# Patient Record
Sex: Female | Born: 1955 | Race: White | Hispanic: No | Smoking: Current every day smoker
Health system: Southern US, Community
[De-identification: ages and names within clinical notes are randomized; demographics above are authoritative.]

## PROBLEM LIST (undated history)

## (undated) DIAGNOSIS — J449 Chronic obstructive pulmonary disease, unspecified: Secondary | ICD-10-CM

## (undated) HISTORY — PX: BACK SURGERY: SHX140

## (undated) HISTORY — PX: APPENDECTOMY: SHX54

## (undated) HISTORY — PX: LEG SURGERY: SHX1003

## (undated) HISTORY — PX: ABDOMINAL HYSTERECTOMY: SHX81

## (undated) HISTORY — PX: HERNIA REPAIR: SHX51

## (undated) HISTORY — PX: PERIPHERAL VASCULAR THROMBECTOMY: CATH118306

---

## 2018-06-05 ENCOUNTER — Emergency Department: Payer: 59

## 2018-06-05 ENCOUNTER — Encounter: Payer: Self-pay | Admitting: *Deleted

## 2018-06-05 ENCOUNTER — Emergency Department
Admission: EM | Admit: 2018-06-05 | Discharge: 2018-06-05 | Disposition: A | Discharge status: Home / Self Care | Payer: 59 | Attending: Student in an Organized Health Care Education/Training Program | Admitting: Student in an Organized Health Care Education/Training Program

## 2018-06-05 ENCOUNTER — Other Ambulatory Visit: Payer: Self-pay

## 2018-06-05 DIAGNOSIS — R2241 Localized swelling, mass and lump, right lower limb: Secondary | ICD-10-CM | POA: Insufficient documentation

## 2018-06-05 DIAGNOSIS — Y999 Unspecified external cause status: Secondary | ICD-10-CM | POA: Diagnosis not present

## 2018-06-05 DIAGNOSIS — L03115 Cellulitis of right lower limb: Secondary | ICD-10-CM

## 2018-06-05 DIAGNOSIS — Y929 Unspecified place or not applicable: Secondary | ICD-10-CM | POA: Diagnosis not present

## 2018-06-05 DIAGNOSIS — X58XXXA Exposure to other specified factors, initial encounter: Secondary | ICD-10-CM | POA: Diagnosis not present

## 2018-06-05 DIAGNOSIS — Y939 Activity, unspecified: Secondary | ICD-10-CM | POA: Insufficient documentation

## 2018-06-05 DIAGNOSIS — F17200 Nicotine dependence, unspecified, uncomplicated: Secondary | ICD-10-CM | POA: Diagnosis not present

## 2018-06-05 DIAGNOSIS — L03113 Cellulitis of right upper limb: Secondary | ICD-10-CM | POA: Insufficient documentation

## 2018-06-05 DIAGNOSIS — S90821A Blister (nonthermal), right foot, initial encounter: Secondary | ICD-10-CM | POA: Diagnosis present

## 2018-06-05 LAB — BASIC METABOLIC PANEL
ANION GAP: 8 (ref 5–15)
BUN: 6 mg/dL — ABNORMAL LOW (ref 8–23)
CHLORIDE: 105 mmol/L (ref 98–111)
CO2: 31 mmol/L (ref 22–32)
Calcium: 8.7 mg/dL — ABNORMAL LOW (ref 8.9–10.3)
Creatinine, Ser: 0.5 mg/dL (ref 0.44–1.00)
GFR calc non Af Amer: >60 mL/min (ref >60)
Glucose, Bld: 94 mg/dL (ref 70–99)
Potassium: 3.4 mmol/L — ABNORMAL LOW (ref 3.5–5.1)
SODIUM: 144 mmol/L (ref 135–145)

## 2018-06-05 LAB — CBC
HEMATOCRIT: 39.2 % (ref 36.0–46.0)
Hemoglobin: 12.4 g/dL (ref 12.0–15.0)
MCH: 30.3 pg (ref 26.0–34.0)
MCHC: 31.6 g/dL (ref 30.0–36.0)
MCV: 95.8 fL (ref 80.0–100.0)
NRBC: 0 % (ref 0.0–0.2)
PLATELETS: 306 10*3/uL (ref 150–400)
RBC: 4.09 MIL/uL (ref 3.87–5.11)
RDW: 16 % — ABNORMAL HIGH (ref 11.5–15.5)
WBC: 6.9 10*3/uL (ref 4.0–10.5)

## 2018-06-05 MED ORDER — HYDROCODONE-ACETAMINOPHEN 5-325 MG PO TABS: 1.0000 | ORAL_TABLET | ORAL | 0 refills | Status: AC | PRN

## 2018-06-05 MED ORDER — BACITRACIN ZINC 500 UNIT/GM EX OINT
TOPICAL_OINTMENT | CUTANEOUS | Status: AC
  Filled 2018-06-05: qty 0.9

## 2018-06-05 MED ORDER — DOXYCYCLINE HYCLATE 100 MG PO TABS
100.0000 mg | ORAL_TABLET | Freq: Once | ORAL | Status: AC
  Administered 2018-06-05: 100 mg via ORAL
  Filled 2018-06-05: qty 1

## 2018-06-05 MED ORDER — APIXABAN 5 MG PO TABS: 5.0000 mg | ORAL_TABLET | Freq: Two times a day (BID) | ORAL | 1 refills | Status: AC

## 2018-06-05 MED ORDER — DOXYCYCLINE HYCLATE 100 MG PO TABS: 100.0000 mg | ORAL_TABLET | Freq: Two times a day (BID) | ORAL | 0 refills | Status: AC

## 2018-06-05 MED ORDER — HYDROCODONE-ACETAMINOPHEN 5-325 MG PO TABS
1.0000 | ORAL_TABLET | Freq: Once | ORAL | Status: AC
  Administered 2018-06-05: 1 via ORAL
  Filled 2018-06-05: qty 1

## 2018-06-05 MED ORDER — BACITRACIN ZINC 500 UNIT/GM EX OINT
TOPICAL_OINTMENT | Freq: Once | CUTANEOUS | Status: AC
  Administered 2018-06-05: 1 via TOPICAL

## 2018-06-05 NOTE — ED Notes (Addendum)
Pt returned from ultrasound

## 2018-06-05 NOTE — ED Notes (Signed)
Xray at the bedside.

## 2018-06-05 NOTE — ED Provider Notes (Signed)
[facility] Emergency Department Provider Note    First MD Initiated Contact with Patient 06/05/18 1841     (approximate)  I have reviewed the triage vital signs and the nursing notes.   HISTORY  Chief Complaint Foot Pain    HPI Katria Botts is a 62 y.o. female with a history of peripheral arterial disease who does smoke daily presents the ER with ulcer to the base of the heel for the past week.  No history of diabetes.  States that she noticed a foul odor today.  States that she thought that the swelling redness is related to blister from wearing her boots after she recently moved.  Denies any other trauma.  States the pain is mild to moderate.  Has not been on any antibiotics recently.    No past medical history on file. No family history on file.  There are no active problems to display for this patient.     Prior to Admission medications   Medication Sig Start Date End Date Taking? Authorizing Provider  apixaban (ELIQUIS) 5 MG TABS tablet Take 1 tablet (5 mg total) by mouth 2 (two) times daily. For days 1-7 take 10mg  (two pills) twice daily 06/05/18   Willy Eddy, MD  doxycycline (VIBRA-TABS) 100 MG tablet Take 1 tablet (100 mg total) by mouth 2 (two) times daily for 7 days. 06/05/18 06/12/18  Willy Eddy, MD  HYDROcodone-acetaminophen (NORCO) 5-325 MG tablet Take 1 tablet by mouth every 4 (four) hours as needed for moderate pain. 06/05/18   Willy Eddy, MD    Allergies Penciclovir and Penicillins    Social History Social History   Tobacco Use  . Smoking status: Current Every Day Smoker  . Smokeless tobacco: Never Used  Substance Use Topics  . Alcohol use: Yes  . Drug use: Never    Review of Systems Patient denies headaches, rhinorrhea, blurry vision, numbness, shortness of breath, chest pain, edema, cough, abdominal pain, nausea, vomiting, diarrhea, dysuria, fevers, rashes or hallucinations unless otherwise stated above  in HPI. ____________________________________________   PHYSICAL EXAM:  VITAL SIGNS: Vitals:   06/05/18 2036 06/05/18 2111  BP: 110/69 104/69  Pulse: 62 65  Resp: 16 16  Temp:    SpO2: 96% 97%    Constitutional: Alert and oriented.  Eyes: Conjunctivae are normal.  Head: Atraumatic. Nose: No congestion/rhinnorhea. Mouth/Throat: Mucous membranes are moist.   Neck: No stridor. Painless ROM.  Cardiovascular: Normal rate, regular rhythm. Grossly normal heart sounds.  Good peripheral circulation. Respiratory: Normal respiratory effort.  No retractions. Lungs CTAB. Gastrointestinal: Soft and nontender. No distention. No abdominal bruits. No CVA tenderness. Genitourinary:  Musculoskeletal: Right heel with 3 cm avulsed blister on the medial aspect no purulent drainage at this point.  There is surrounding cellulitis.  No crepitus.  Able to palpate DP or PT pulses but strong DP and PT Doppler signals present bilaterally.  Brisk cap refill present.  No joint effusions. Neurologic:  Normal speech and language. No gross focal neurologic deficits are appreciated. No facial droop Skin:  Skin is warm, dry and intact. No rash noted. Psychiatric: Mood and affect are normal. Speech and behavior are normal.  ____________________________________________   LABS (all labs ordered are listed, but only abnormal results are displayed)  Results for orders placed or performed during the hospital encounter of 06/05/18 (from the past 24 hour(s))  Basic metabolic panel     Status: Abnormal   Collection Time: 06/05/18  3:32 PM  Result Value Ref  Range   Sodium 144 135 - 145 mmol/L   Potassium 3.4 (L) 3.5 - 5.1 mmol/L   Chloride 105 98 - 111 mmol/L   CO2 31 22 - 32 mmol/L   Glucose, Bld 94 70 - 99 mg/dL   BUN 6 (L) 8 - 23 mg/dL   Creatinine, Ser 1.61 0.44 - 1.00 mg/dL   Calcium 8.7 (L) 8.9 - 10.3 mg/dL   GFR calc non Af Amer >60 >60 mL/min   GFR calc Af Amer >60 >60 mL/min   Anion gap 8 5 - 15  CBC      Status: Abnormal   Collection Time: 06/05/18  3:32 PM  Result Value Ref Range   WBC 6.9 4.0 - 10.5 K/uL   RBC 4.09 3.87 - 5.11 MIL/uL   Hemoglobin 12.4 12.0 - 15.0 g/dL   HCT 09.6 04.5 - 40.9 %   MCV 95.8 80.0 - 100.0 fL   MCH 30.3 26.0 - 34.0 pg   MCHC 31.6 30.0 - 36.0 g/dL   RDW 81.1 (H) 91.4 - 78.2 %   Platelets 306 150 - 400 K/uL   nRBC 0.0 0.0 - 0.2 %   ____________________________________________   ____________________________________________  RADIOLOGY  I personally reviewed all radiographic images ordered to evaluate for the above acute complaints and reviewed radiology reports and findings.  These findings were personally discussed with the patient.  Please see medical record for radiology report.  ____________________________________________   PROCEDURES  Procedure(s) performed:  Procedures    Critical Care performed: no ____________________________________________   INITIAL IMPRESSION / ASSESSMENT AND PLAN / ED COURSE  Pertinent labs & imaging results that were available during my care of the patient were reviewed by me and considered in my medical decision making (see chart for details).   DDX: Cellulitis, diabetic ulcer, limb ischemia, osteo-, edema, DVT, fracture  Orlanda Frankum is a 62 y.o. who presents to the ED with his as described above.  Patient nontoxic-appearing.  Does have palpable pulses and strong Doppler signal to the PT and DP.  Not consistent with necrotizing fasciitis.  Likely some soft tissue breakdown causing surrounding cellulitis however given swelling and edema will order ultrasound as well as x-ray.  Clinical Course as of Jun 05 2350  Mon Jun 05, 2018  2046 X-rays show some edema and given the evidence of nonocclusive thrombus in lower extremity with her smoking history will treat with oral antibiotics.  No evidence of acute limb ischemia.  Discussed risks and benefits of anticoagulation versus conservative management regarding thrombus  found.  Given her risk factors will treat with Eliquis.  Discussed signs symptoms and risks of bleeding patient agrees to trial of Eliquis therapy.   [PR]    Clinical Course User Index [PR] Willy Eddy, MD     As part of my medical decision making, I reviewed the following data within the electronic MEDICAL RECORD NUMBER Nursing notes reviewed and incorporated, Labs reviewed, notes from prior ED visits.   ____________________________________________   FINAL CLINICAL IMPRESSION(S) / ED DIAGNOSES  Final diagnoses:  Cellulitis of right lower extremity      NEW MEDICATIONS STARTED DURING THIS VISIT:  Discharge Medication List as of 06/05/2018  8:37 PM    START taking these medications   Details  apixaban (ELIQUIS) 5 MG TABS tablet Take 1 tablet (5 mg total) by mouth 2 (two) times daily. For days 1-7 take 10mg  (two pills) twice daily, Starting Mon 06/05/2018, Print    doxycycline (VIBRA-TABS) 100 MG tablet  Take 1 tablet (100 mg total) by mouth 2 (two) times daily for 7 days., Starting Mon 06/05/2018, Until Mon 06/12/2018, Print    HYDROcodone-acetaminophen (NORCO) 5-325 MG tablet Take 1 tablet by mouth every 4 (four) hours as needed for moderate pain., Starting Mon 06/05/2018, Print         Note:  This document was prepared using Dragon voice recognition software and may include unintentional dictation errors.    Willy Eddyobinson, Renesmay Nesbitt, MD 06/05/18 2351

## 2018-06-05 NOTE — ED Triage Notes (Signed)
Pt has swelling to right foot with ulcer to the heel for 1 week. Foul odor noted.  No hx diabetes.  Pt alert.

## 2018-06-05 NOTE — ED Notes (Addendum)
Pt to ultrasound

## 2018-06-05 NOTE — Discharge Instructions (Addendum)
Return for worsening pain, fevers, worsening redness or for any additional questions or concerns.

## 2018-07-06 ENCOUNTER — Emergency Department: Payer: 59

## 2018-07-06 ENCOUNTER — Encounter: Payer: Self-pay | Admitting: Emergency Medicine

## 2018-07-06 ENCOUNTER — Emergency Department
Admission: EM | Admit: 2018-07-06 | Discharge: 2018-07-06 | Disposition: A | Discharge status: Home / Self Care | Payer: 59 | Attending: Emergency Medicine | Admitting: Emergency Medicine

## 2018-07-06 DIAGNOSIS — Z7901 Long term (current) use of anticoagulants: Secondary | ICD-10-CM | POA: Insufficient documentation

## 2018-07-06 DIAGNOSIS — Z79899 Other long term (current) drug therapy: Secondary | ICD-10-CM | POA: Insufficient documentation

## 2018-07-06 DIAGNOSIS — M542 Cervicalgia: Secondary | ICD-10-CM | POA: Diagnosis present

## 2018-07-06 DIAGNOSIS — W19XXXA Unspecified fall, initial encounter: Secondary | ICD-10-CM

## 2018-07-06 DIAGNOSIS — Z7984 Long term (current) use of oral hypoglycemic drugs: Secondary | ICD-10-CM | POA: Diagnosis not present

## 2018-07-06 DIAGNOSIS — R51 Headache: Secondary | ICD-10-CM | POA: Diagnosis not present

## 2018-07-06 DIAGNOSIS — W108XXA Fall (on) (from) other stairs and steps, initial encounter: Secondary | ICD-10-CM | POA: Insufficient documentation

## 2018-07-06 DIAGNOSIS — Y929 Unspecified place or not applicable: Secondary | ICD-10-CM | POA: Diagnosis not present

## 2018-07-06 DIAGNOSIS — Y999 Unspecified external cause status: Secondary | ICD-10-CM | POA: Insufficient documentation

## 2018-07-06 DIAGNOSIS — Y939 Activity, unspecified: Secondary | ICD-10-CM | POA: Diagnosis not present

## 2018-07-06 DIAGNOSIS — J449 Chronic obstructive pulmonary disease, unspecified: Secondary | ICD-10-CM | POA: Insufficient documentation

## 2018-07-06 DIAGNOSIS — F17210 Nicotine dependence, cigarettes, uncomplicated: Secondary | ICD-10-CM | POA: Insufficient documentation

## 2018-07-06 HISTORY — DX: Chronic obstructive pulmonary disease, unspecified: J44.9

## 2018-07-06 MED ORDER — METHOCARBAMOL 500 MG PO TABS: 500.0000 mg | ORAL_TABLET | Freq: Three times a day (TID) | ORAL | 0 refills | Status: AC | PRN

## 2018-07-06 NOTE — ED Triage Notes (Signed)
Patient presents to the ED post fall.  Patient states she takes eliquis and she was told to come to the ED if she hit her head and she did.  Patient states she fell approx. 2 hours ago.  Patient states she missed the last two steps of her stairs and fell on her back and hit her head.  Patient denies loss of consciousness.  Patient denies nausea and vomiting.

## 2018-07-06 NOTE — ED Provider Notes (Signed)
[facility] Emergency Department Provider Note  ____________________________________________  Time seen: Approximately 8:52 PM  I have reviewed the triage vital signs and the nursing notes.   HISTORY  Chief Complaint Fall    HPI Lisa Stark is a 62 y.o. female presents to the emergency department after a fall that occurred tonight.  Patient reports that she missed the last 2 steps and fell backwards hitting her head.  Patient is currently taking Eliquis and became concerned.  She experienced no loss of consciousness but did have some neck discomfort.  No numbness or tingling in the upper or lower extremities.  She denies chest pain, chest tightness, shortness of breath, nausea, vomiting abdominal pain.  She has been able to ambulate without difficulty.  Patient is requesting oxycodone 10 mg for pain.   Past Medical History:  Diagnosis Date  . COPD (chronic obstructive pulmonary disease) (HCC)     There are no active problems to display for this patient.    Prior to Admission medications   Medication Sig Start Date End Date Taking? Authorizing Provider  apixaban (ELIQUIS) 5 MG TABS tablet Take 1 tablet (5 mg total) by mouth 2 (two) times daily. For days 1-7 take 10mg  (two pills) twice daily 06/05/18   Willy Eddyobinson, Patrick, MD  HYDROcodone-acetaminophen [facility](NORCO) 5-325 MG tablet Take 1 tablet by mouth every 4 (four) hours as needed for moderate pain. 06/05/18   Willy Eddyobinson, Patrick, MD  methocarbamol (ROBAXIN) 500 MG tablet Take 1 tablet (500 mg total) by mouth every 8 (eight) hours as needed for up to 5 days. 07/06/18 07/11/18  Orvil FeilWoods, Karol Liendo M, PA-C    Allergies Penciclovir and Penicillins  No family history on file.  Social History Social History   Tobacco Use  . Smoking status: Current Every Day Smoker    Packs/day: 0.50    Types: Cigarettes  . Smokeless tobacco: Never Used  Substance Use Topics  . Alcohol use: Yes    Comment: occasionally  . Drug use:  Never     Review of Systems  Constitutional: No fever/chills Eyes: No visual changes. No discharge ENT: No upper respiratory complaints. Cardiovascular: no chest pain. Respiratory: no cough. No SOB. Gastrointestinal: No abdominal pain.  No nausea, no vomiting.  No diarrhea.  No constipation. Musculoskeletal: Patient has neck pain. Skin: Negative for rash, abrasions, lacerations, ecchymosis. Neurological: Patient has mild headache, no focal weakness or numbness.   ____________________________________________   PHYSICAL EXAM:  VITAL SIGNS: ED Triage Vitals  Enc Vitals Group     BP 07/06/18 1845 125/88     Pulse Rate 07/06/18 1845 76     Resp 07/06/18 1845 18     Temp 07/06/18 1845 98.3 F (36.8 C)     Temp Source 07/06/18 1845 Oral     SpO2 07/06/18 1845 97 %     Weight 07/06/18 1848 170 lb (77.1 kg)     Height 07/06/18 1848 5\' 9"  (1.753 m)     Head Circumference --      Peak Flow --      Pain Score 07/06/18 1846 8     Pain Loc --      Pain Edu? --      Excl. in GC? --      Constitutional: Alert and oriented. Well appearing and in no acute distress. Eyes: Conjunctivae are normal. PERRL. EOMI. Head: Atraumatic. ENT:      Ears: TMs are pearly.      Nose: No congestion/rhinnorhea.  Mouth/Throat: Mucous membranes are moist.  Neck: No stridor.  No cervical spine tenderness to palpation.  Full range of motion.  No midline C-spine tenderness. Cardiovascular: Normal rate, regular rhythm. Normal S1 and S2.  Good peripheral circulation. Respiratory: Normal respiratory effort without tachypnea or retractions. Lungs CTAB. Good air entry to the bases with no decreased or absent breath sounds. Gastrointestinal: Bowel sounds 4 quadrants. Soft and nontender to palpation. No guarding or rigidity. No palpable masses. No distention. No CVA tenderness. Musculoskeletal: Full range of motion to all extremities. No gross deformities appreciated. Neurologic:  Normal speech and  language. No gross focal neurologic deficits are appreciated.  Skin:  Skin is warm, dry and intact. No rash noted. Psychiatric: Mood and affect are normal. Speech and behavior are normal. Patient exhibits appropriate insight and judgement.   ____________________________________________   LABS (all labs ordered are listed, but only abnormal results are displayed)  Labs Reviewed - No data to display ____________________________________________  EKG   ____________________________________________  RADIOLOGY I personally viewed and evaluated these images as part of my medical decision making, as well as reviewing the written report by the radiologist.  Ct Head Wo Contrast  Result Date: 07/06/2018 CLINICAL DATA:  Patient status post fall hitting the back of her head. EXAM: CT HEAD WITHOUT CONTRAST CT CERVICAL SPINE WITHOUT CONTRAST TECHNIQUE: Multidetector CT imaging of the head and cervical spine was performed following the standard protocol without intravenous contrast. Multiplanar CT image reconstructions of the cervical spine were also generated. COMPARISON:  None. FINDINGS: CT HEAD FINDINGS Brain: Ventricles and sulci are appropriate for patient's age. No evidence for acute cortically based infarct, intracranial hemorrhage, mass lesion or mass-effect. 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 Vascular: Unremarkable Skull: Intact. Findings suggestive of remote right zygomatic arch fracture. Sinuses/Orbits: Paranasal sinuses well aerated. Mild mucosal thickening right frontal sinus. Mastoid air cells unremarkable. Other: None. CT CERVICAL SPINE FINDINGS Alignment: Grade 1 anterolisthesis of C4 on C5 likely secondary to facet degenerative changes. Degenerative disc disease most pronounced C5-6, C6-7 C7-T1. Skull base and vertebrae: No acute fracture. No primary bone lesion or focal pathologic process. Soft tissues and spinal canal: No  prevertebral fluid or swelling. No visible canal hematoma. Disc levels: C5-6, C6-7 and C7-T1 degenerative changes. No acute fracture. Upper chest: Emphysematous change. Other: None. IMPRESSION: 1. No acute intracranial process. 2. No acute cervical spine fracture. 3. Multilevel degenerative disc and facet disease. Electronically Signed   By: Annia Beltrew  Davis M.D.   On: 07/06/2018 19:43   Ct Cervical Spine Wo Contrast  Result Date: 07/06/2018 CLINICAL DATA:  Patient status post fall hitting the back of her head. EXAM: CT HEAD WITHOUT CONTRAST CT CERVICAL SPINE WITHOUT CONTRAST TECHNIQUE: Multidetector CT imaging of the head and cervical spine was performed following the standard protocol without intravenous contrast. Multiplanar CT image reconstructions of the cervical spine were also generated. COMPARISON:  None. FINDINGS: CT HEAD FINDINGS Brain: Ventricles and sulci are appropriate for patient's age. No evidence for acute cortically based infarct, intracranial hemorrhage, mass lesion or mass-effect. 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 Vascular: Unremarkable Skull: Intact. Findings suggestive of remote right zygomatic arch fracture. Sinuses/Orbits: Paranasal sinuses well aerated. Mild mucosal thickening right frontal sinus. Mastoid air cells unremarkable. Other: None. CT CERVICAL SPINE FINDINGS Alignment: Grade 1 anterolisthesis of C4 on C5 likely secondary to facet degenerative changes. Degenerative disc disease most pronounced C5-6, C6-7 C7-T1.  Skull base and vertebrae: No acute fracture. No primary bone lesion or focal pathologic process. Soft tissues and spinal canal: No prevertebral fluid or swelling. No visible canal hematoma. Disc levels: C5-6, C6-7 and C7-T1 degenerative changes. No acute fracture. Upper chest: Emphysematous change. Other: None. IMPRESSION: 1. No acute intracranial process. 2. No acute cervical spine fracture. 3. Multilevel  degenerative disc and facet disease. Electronically Signed   By: Annia Belt M.D.   On: 07/06/2018 19:43    ____________________________________________    PROCEDURES  Procedure(s) performed:    Procedures    Medications - No data to display   ____________________________________________   INITIAL IMPRESSION / ASSESSMENT AND PLAN / ED COURSE  Pertinent labs & imaging results that were available during my care of the patient were reviewed by me and considered in my medical decision making (see chart for details).  Review of the [place] CSRS was performed in accordance of the [facility] prior to dispensing any controlled drugs.      Assessment and plan Fall Patient presents to the emergency department after a fall that occurred earlier this evening.  Patient missed the last 2 steps as she was walking from her house.  CT head and neck revealed no acute abnormality.  Patient requested oxycodone tens as she is currently in the process of establishing care with pain management.  Patient's request was denied as patient did not sustain a fracture and has a reassuring physical exam. Patient did request a muscle relaxer.  Patient was discharged with Robaxin.  Vital signs were reassuring prior to discharge.  All patient questions were answered.   ____________________________________________  FINAL CLINICAL IMPRESSION(S) / ED DIAGNOSES  Final diagnoses:  Fall, initial encounter      NEW MEDICATIONS STARTED DURING THIS VISIT:  ED Discharge Orders         Ordered    methocarbamol (ROBAXIN) 500 MG tablet  Every 8 hours PRN     07/06/18 2031              This chart was dictated using voice recognition software/Dragon. Despite best efforts to proofread, errors can occur which can change the meaning. Any change was purely unintentional.    Orvil Feil, PA-C 07/06/18 2103    Arnaldo Natal, MD 07/07/18 [phone]

## 2019-10-04 IMAGING — CT CT HEAD W/O CM
4 of 7 series · 14 of 47 positions shown, 15 images · non-contrast
Comparison: None.

CLINICAL DATA: Patient status post fall hitting the back of her
head.

EXAM:
CT HEAD WITHOUT CONTRAST
CT CERVICAL SPINE WITHOUT CONTRAST
TECHNIQUE: Multidetector CT imaging of the head and cervical spine was
performed following the standard protocol without intravenous
contrast. Multiplanar CT image reconstructions of the cervical spine
were also generated.

[Series 2: head wo · axial · 0.47mm/px · z∈[-111,-56]mm · 2 of 33 slices shown, 3 images]
[im 11/33  brain]
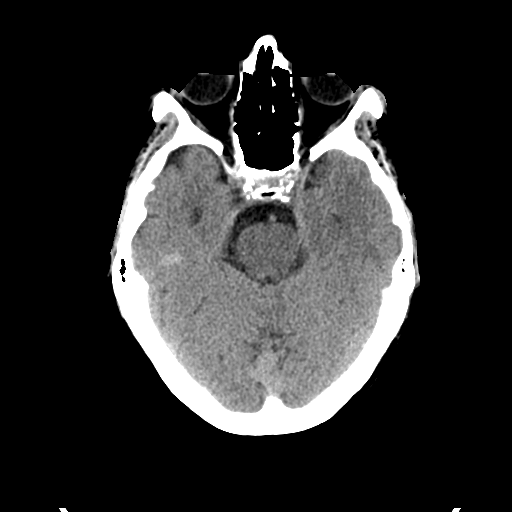
[im 11/33  bone]
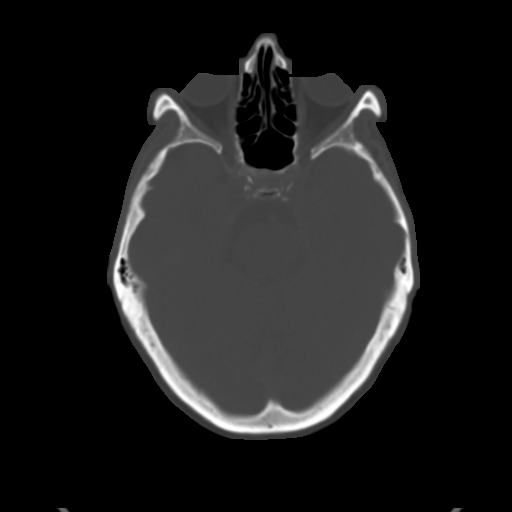
[im 22/33  brain]
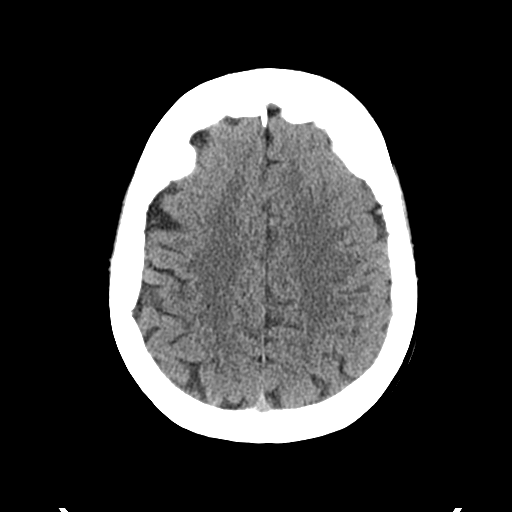

[Series 4: coronal soft tissue · coronal · 0.32mm/px · 2 of 67 slices shown]
[im 13/67  brain]
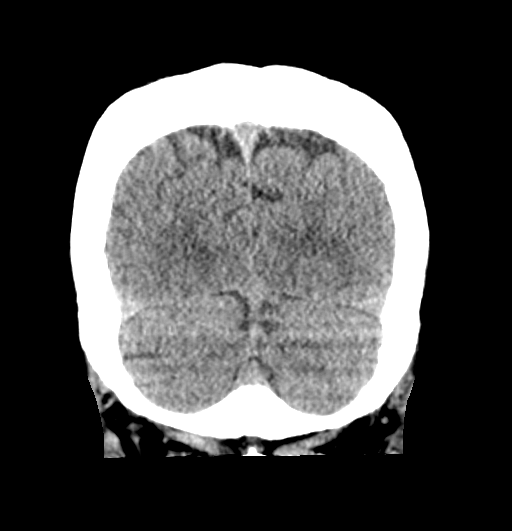
[im 26/67  brain]
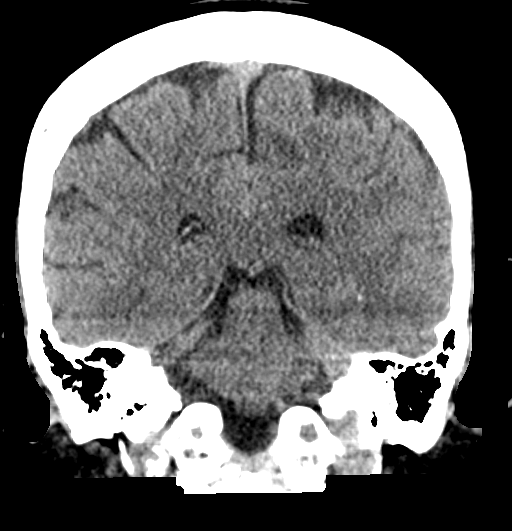

[Series 5: sagittal soft tissue · sagittal · 0.33mm/px · 2 of 54 slices shown]
[im 18/54  brain]
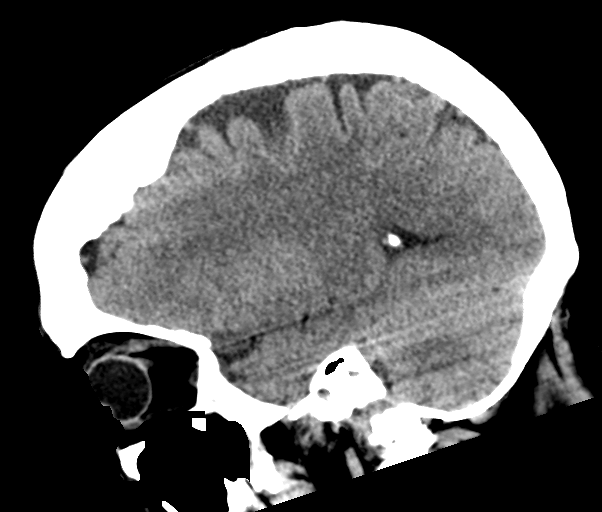
[im 36/54  brain]
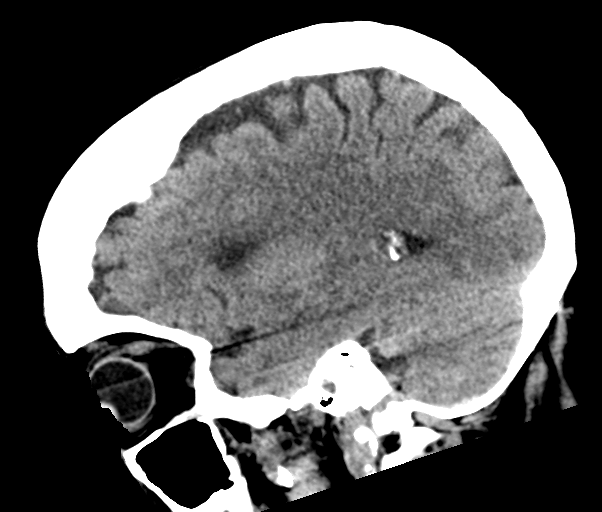

[Series 12: orthogonal bone · axial · 0.20mm/px · z∈[-330,-193]mm · 8 of 94 slices shown]
[im 8/94  bone]
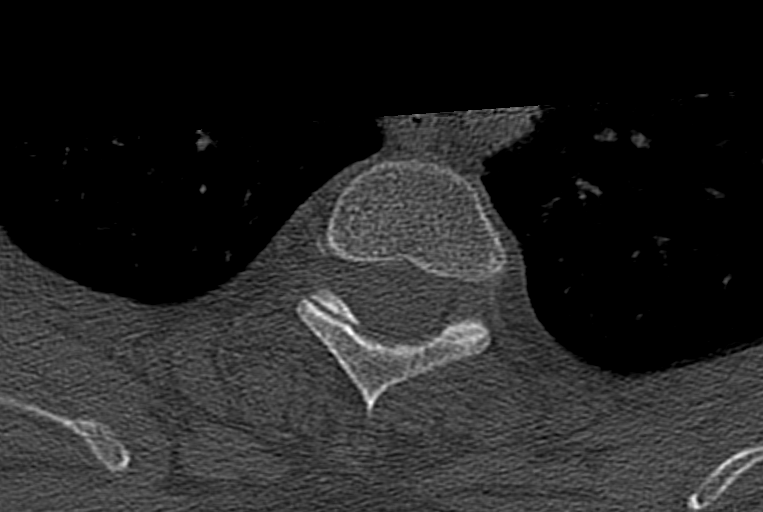
[im 22/94  bone]
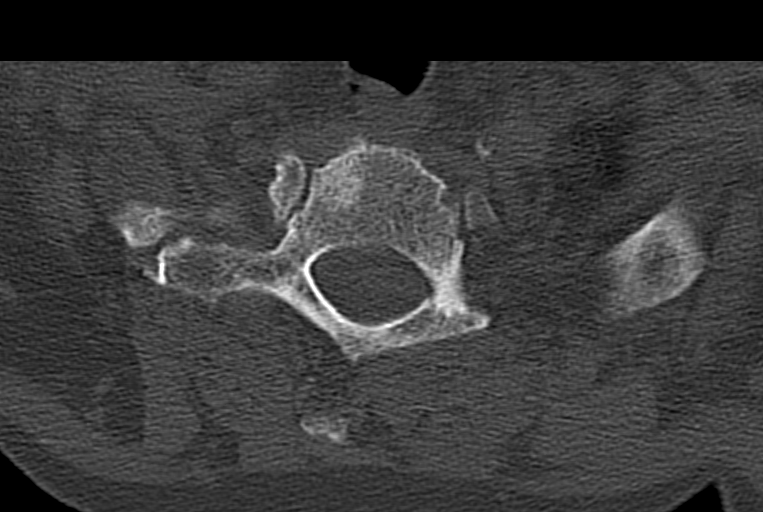
[im 29/94  bone]
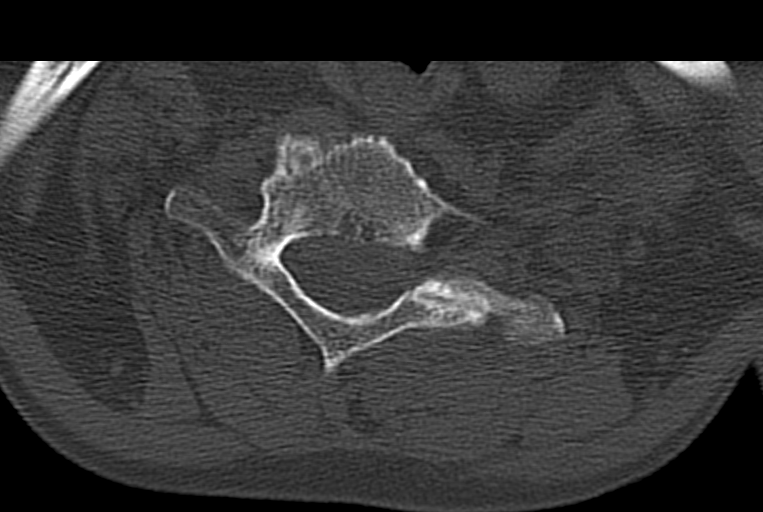
[im 43/94  bone]
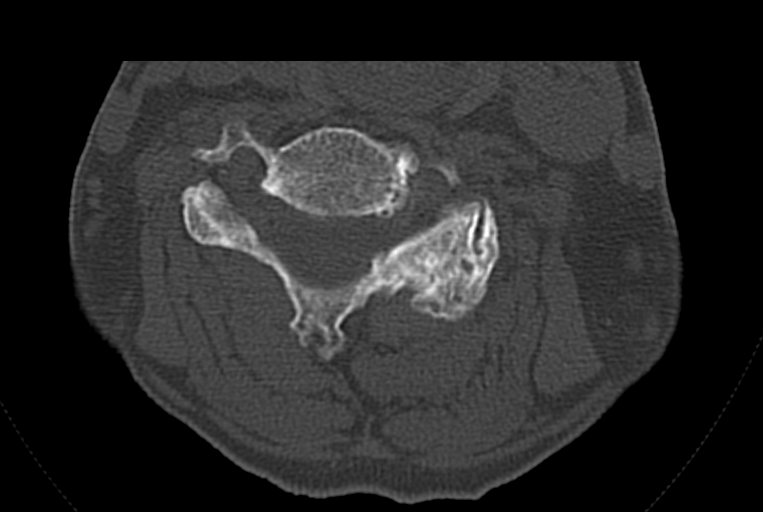
[im 51/94  bone]
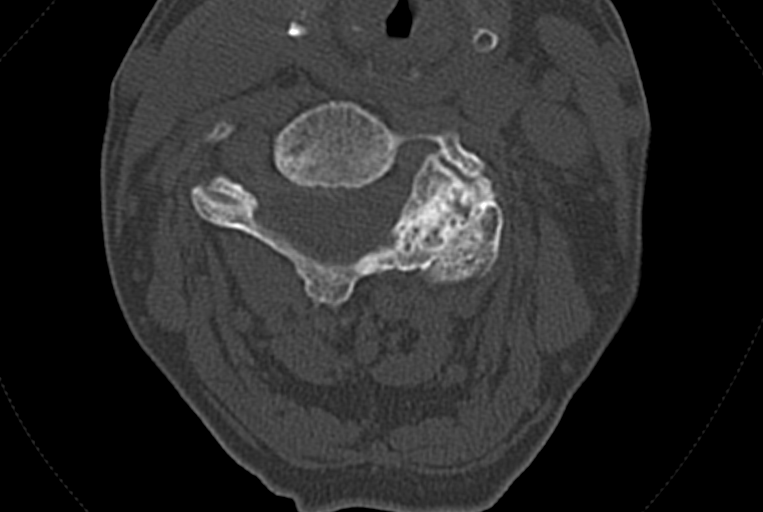
[im 65/94  bone]
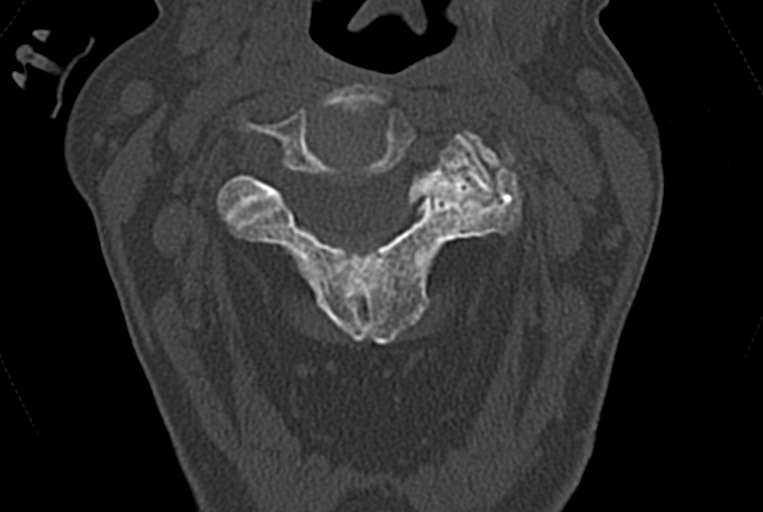
[im 72/94  bone]
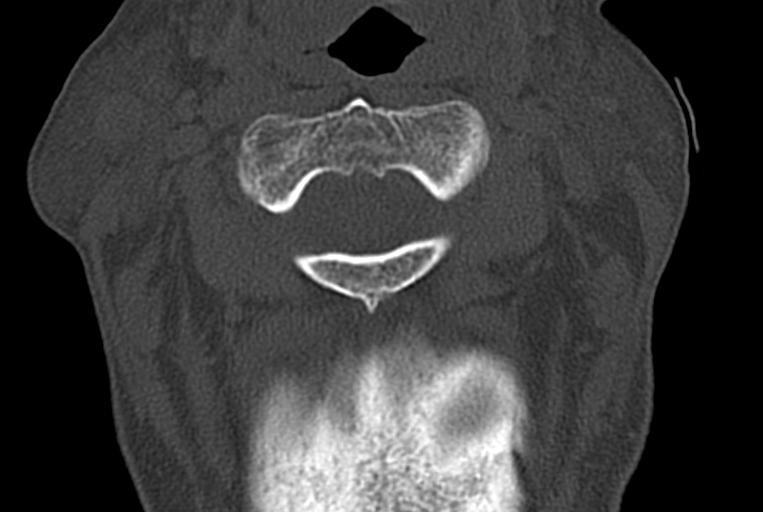
[im 86/94  bone]
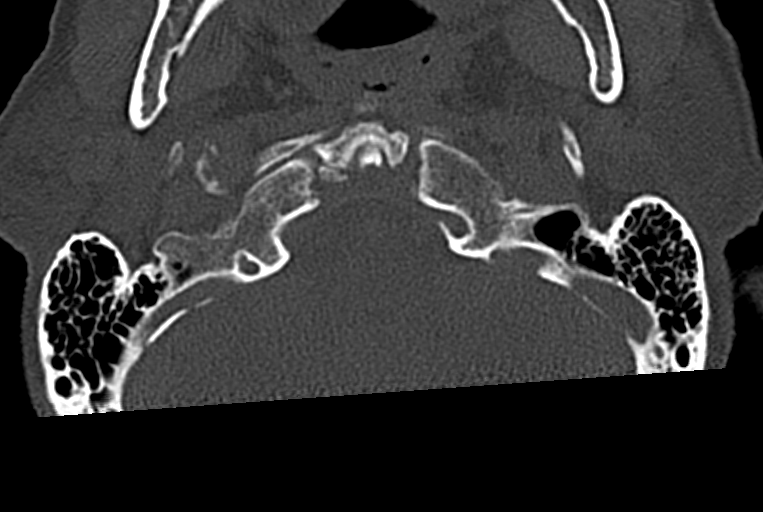

[14 of 47 positions shown; findings below may reference images not displayed]

FINDINGS: CT HEAD FINDINGS

Brain: Ventricles and sulci are appropriate for patient's age. No
evidence for acute cortically based infarct, intracranial
hemorrhage, mass lesion or mass-effect. Focal encephalomalacia
within the right insula (image 18; series 2) most compatible with
old infarct.

Vascular: Unremarkable

Skull: Intact. Findings suggestive of remote right zygomatic arch
fracture.

Sinuses/Orbits: Paranasal sinuses well aerated. Mild mucosal
thickening right frontal sinus. Mastoid air cells unremarkable.

Other: None.

CT CERVICAL SPINE FINDINGS

Alignment: Grade 1 anterolisthesis of C4 on C5 likely secondary to
facet degenerative changes. Degenerative disc disease most
pronounced C5-6, C6-7 C7-T1.

Skull base and vertebrae: No acute fracture. No primary bone lesion
or focal pathologic process.

Soft tissues and spinal canal: No prevertebral fluid or swelling. No
visible canal hematoma.

Disc levels: C5-6, C6-7 and C7-T1 degenerative changes. No acute
fracture.

Upper chest: Emphysematous change.

Other: None.
IMPRESSION: 1. No acute intracranial process.
2. No acute cervical spine fracture.
3. Multilevel degenerative disc and facet disease.
